# Patient Record
Sex: Male | Born: 1960 | Race: Black or African American | Hispanic: No | Marital: Single | Smoking: Current every day smoker
Health system: Southern US, Community
[De-identification: ages and names within clinical notes are randomized; demographics above are authoritative.]

## PROBLEM LIST (undated history)

## (undated) DIAGNOSIS — K219 Gastro-esophageal reflux disease without esophagitis: Secondary | ICD-10-CM

## (undated) DIAGNOSIS — F329 Major depressive disorder, single episode, unspecified: Secondary | ICD-10-CM

## (undated) DIAGNOSIS — Z970 Presence of artificial eye: Secondary | ICD-10-CM

## (undated) DIAGNOSIS — F419 Anxiety disorder, unspecified: Secondary | ICD-10-CM

## (undated) DIAGNOSIS — I10 Essential (primary) hypertension: Secondary | ICD-10-CM

## (undated) DIAGNOSIS — H409 Unspecified glaucoma: Secondary | ICD-10-CM

## (undated) DIAGNOSIS — F1910 Other psychoactive substance abuse, uncomplicated: Secondary | ICD-10-CM

## (undated) DIAGNOSIS — F32A Depression, unspecified: Secondary | ICD-10-CM

## (undated) DIAGNOSIS — F1020 Alcohol dependence, uncomplicated: Secondary | ICD-10-CM

## (undated) HISTORY — DX: Gastro-esophageal reflux disease without esophagitis: K21.9

## (undated) HISTORY — DX: Presence of artificial eye: Z97.0

## (undated) HISTORY — DX: Anxiety disorder, unspecified: F41.9

## (undated) HISTORY — DX: Essential (primary) hypertension: I10

## (undated) HISTORY — DX: Unspecified glaucoma: H40.9

## (undated) HISTORY — PX: KNEE SURGERY: SHX244

## (undated) HISTORY — DX: Depression, unspecified: F32.A

## (undated) HISTORY — PX: NOSE SURGERY: SHX723

---
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
## 2015-04-14 HISTORY — PX: OCULAR PROSTHESIS REMOVAL: SHX364

## 2017-02-18 ENCOUNTER — Other Ambulatory Visit: Payer: Self-pay | Admitting: Nurse Practitioner

## 2017-02-18 DIAGNOSIS — M545 Low back pain: Secondary | ICD-10-CM

## 2017-02-24 ENCOUNTER — Ambulatory Visit (HOSPITAL_COMMUNITY)
Admission: RE | Admit: 2017-02-24 | Discharge: 2017-02-24 | Disposition: A | Discharge status: Home / Self Care | Payer: 59 | Source: Ambulatory Visit | Attending: Nurse Practitioner | Admitting: Nurse Practitioner

## 2017-02-24 DIAGNOSIS — M48061 Spinal stenosis, lumbar region without neurogenic claudication: Secondary | ICD-10-CM | POA: Insufficient documentation

## 2017-02-24 DIAGNOSIS — R200 Anesthesia of skin: Secondary | ICD-10-CM | POA: Diagnosis not present

## 2017-02-24 DIAGNOSIS — M545 Low back pain: Secondary | ICD-10-CM | POA: Insufficient documentation

## 2017-02-24 DIAGNOSIS — M5136 Other intervertebral disc degeneration, lumbar region: Secondary | ICD-10-CM | POA: Insufficient documentation

## 2017-03-02 ENCOUNTER — Ambulatory Visit (HOSPITAL_COMMUNITY): Payer: Self-pay | Admitting: Licensed Clinical Social Worker

## 2017-03-02 ENCOUNTER — Encounter (INDEPENDENT_AMBULATORY_CARE_PROVIDER_SITE_OTHER): Payer: Self-pay

## 2017-03-02 ENCOUNTER — Encounter (HOSPITAL_COMMUNITY): Payer: Self-pay | Admitting: Licensed Clinical Social Worker

## 2017-03-02 DIAGNOSIS — F4323 Adjustment disorder with mixed anxiety and depressed mood: Secondary | ICD-10-CM
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
## 2017-03-17 ENCOUNTER — Other Ambulatory Visit: Payer: Self-pay | Admitting: Orthopedic Surgery

## 2017-03-17 DIAGNOSIS — M5416 Radiculopathy, lumbar region: Principal | ICD-10-CM

## 2017-03-17 DIAGNOSIS — M48061 Spinal stenosis, lumbar region without neurogenic claudication: Secondary | ICD-10-CM

## 2017-04-02 ENCOUNTER — Ambulatory Visit
Admission: RE | Admit: 2017-04-02 | Discharge: 2017-04-02 | Disposition: A | Discharge status: Home / Self Care | Payer: 59 | Source: Ambulatory Visit | Attending: Orthopedic Surgery | Admitting: Orthopedic Surgery

## 2017-04-02 DIAGNOSIS — M5416 Radiculopathy, lumbar region: Principal | ICD-10-CM

## 2017-04-02 DIAGNOSIS — M48061 Spinal stenosis, lumbar region without neurogenic claudication: Secondary | ICD-10-CM

## 2017-04-02 MED ORDER — ONDANSETRON HCL 4 MG/2ML IJ SOLN
4.0000 mg | Freq: Once | INTRAMUSCULAR | Status: AC
  Administered 2017-04-02: 4 mg via INTRAMUSCULAR

## 2017-04-02 MED ORDER — ONDANSETRON HCL 4 MG/2ML IJ SOLN: 4.0000 mg | Freq: Four times a day (QID) | INTRAMUSCULAR | Status: DC | PRN

## 2017-04-02 MED ORDER — DIAZEPAM 5 MG PO TABS
10.0000 mg | ORAL_TABLET | Freq: Once | ORAL | Status: AC
  Administered 2017-04-02: 10 mg via ORAL

## 2017-04-02 MED ORDER — IOPAMIDOL (ISOVUE-M 200) INJECTION 41%
14.0000 mL | Freq: Once | INTRAMUSCULAR | Status: AC
  Administered 2017-04-02: 14 mL via INTRATHECAL

## 2017-04-02 MED ORDER — MEPERIDINE HCL 50 MG/ML IJ SOLN
50.0000 mg | Freq: Once | INTRAMUSCULAR | Status: AC
  Administered 2017-04-02: 50 mg via INTRAMUSCULAR

## 2017-04-02 NOTE — Discharge Instructions (Signed)

## 2017-04-14 ENCOUNTER — Ambulatory Visit (HOSPITAL_COMMUNITY): Payer: 59 | Admitting: Licensed Clinical Social Worker

## 2017-04-21 ENCOUNTER — Ambulatory Visit (HOSPITAL_COMMUNITY): Payer: Self-pay | Admitting: Licensed Clinical Social Worker

## 2017-08-18 NOTE — Congregational Nurse Program (Signed)
Congregational Nurse Program Note  Date of Encounter: 08/18/2017  Past Medical History: No past medical history on file.  Encounter Details: CNP Questionnaire - 08/18/17 1119      Questionnaire   Patient Status  Not Applicable    Race  Black or African American    Location Patient Served At  [employer], [employer]  Not Applicable    Uninsured  Uninsured (NEW 1x/quarter)    Food  No food insecurities    Housing/Utilities  Yes, have permanent housing    Transportation  No transportation needs    Interpersonal Safety  Yes, feel physically and emotionally safe where you currently live    Medication  No medication insecurities    Medical Provider  No    Referrals  Not Applicable    ED Visit Averted  Not Applicable    Life-Saving Intervention Made  Not Applicable     Seen at the [facility].Does not have a medical provider I nformation given about.[facility] B/P/123/79, P 80 Jenene Slicker RN., [facility], [phone]

## 2017-10-19 NOTE — Congregational Nurse Program (Signed)
Congregational Nurse Program Note  Date of Encounter: 10/19/2017  Past Medical History: No past medical history on file.  Encounter Details: CNP Questionnaire - 10/19/17 2130      Questionnaire   Patient Status  Not Applicable    Race  Black or African American    [facility] [place]    Insurance  Private Insurance    Uninsured  Not Applicable    Food  No food insecurities    Housing/Utilities  Yes, have permanent housing    Transportation  No transportation needs    Interpersonal Safety  Yes, feel physically and emotionally safe where you currently live    Medication  No medication insecurities    Medical Provider  No    Referrals  Not Applicable    ED Visit Averted  Not Applicable    Life-Saving Intervention Made  Not Applicable      [facility] 09[facility]. No complaints  B/P 132/87 -P [address] Torah Pinnock RN, [facility], [phone]

## 2017-12-15 ENCOUNTER — Emergency Department (HOSPITAL_COMMUNITY)
Admission: EM | Admit: 2017-12-15 | Discharge: 2017-12-15 | Disposition: A | Discharge status: Home / Self Care | Payer: No Typology Code available for payment source | Attending: Emergency Medicine | Admitting: Emergency Medicine

## 2017-12-15 ENCOUNTER — Emergency Department (HOSPITAL_COMMUNITY): Payer: No Typology Code available for payment source

## 2017-12-15 ENCOUNTER — Encounter (HOSPITAL_COMMUNITY): Payer: Self-pay | Admitting: Emergency Medicine

## 2017-12-15 ENCOUNTER — Other Ambulatory Visit: Payer: Self-pay

## 2017-12-15 DIAGNOSIS — T148XXA Other injury of unspecified body region, initial encounter: Secondary | ICD-10-CM | POA: Insufficient documentation

## 2017-12-15 DIAGNOSIS — S93402A Sprain of unspecified ligament of left ankle, initial encounter: Secondary | ICD-10-CM | POA: Insufficient documentation

## 2017-12-15 DIAGNOSIS — Y929 Unspecified place or not applicable: Secondary | ICD-10-CM | POA: Insufficient documentation

## 2017-12-15 DIAGNOSIS — Y9389 Activity, other specified: Secondary | ICD-10-CM | POA: Diagnosis not present

## 2017-12-15 DIAGNOSIS — S99912A Unspecified injury of left ankle, initial encounter: Secondary | ICD-10-CM | POA: Diagnosis present

## 2017-12-15 DIAGNOSIS — Y999 Unspecified external cause status: Secondary | ICD-10-CM | POA: Insufficient documentation

## 2017-12-15 DIAGNOSIS — T07XXXA Unspecified multiple injuries, initial encounter: Secondary | ICD-10-CM

## 2017-12-15 LAB — BASIC METABOLIC PANEL
ANION GAP: 9 (ref 5–15)
BUN: 5 mg/dL — ABNORMAL LOW (ref 6–20)
CHLORIDE: 107 mmol/L (ref 98–111)
CO2: 26 mmol/L (ref 22–32)
Calcium: 8.9 mg/dL (ref 8.9–10.3)
Creatinine, Ser: 1.06 mg/dL (ref 0.61–1.24)
GFR calc non Af Amer: >60 mL/min (ref >60)
Glucose, Bld: 98 mg/dL (ref 70–99)
Potassium: 3.8 mmol/L (ref 3.5–5.1)
Sodium: 142 mmol/L (ref 135–145)

## 2017-12-15 MED ORDER — CYCLOBENZAPRINE HCL 10 MG PO TABS: 10.0000 mg | ORAL_TABLET | Freq: Three times a day (TID) | ORAL | 0 refills | Status: DC

## 2017-12-15 MED ORDER — HYDROCODONE-ACETAMINOPHEN 5-325 MG PO TABS
2.0000 | ORAL_TABLET | Freq: Once | ORAL | Status: AC
  Administered 2017-12-15: 2 via ORAL
  Filled 2017-12-15: qty 2

## 2017-12-15 MED ORDER — METHOCARBAMOL 500 MG PO TABS
500.0000 mg | ORAL_TABLET | Freq: Once | ORAL | Status: AC
  Administered 2017-12-15: 500 mg via ORAL
  Filled 2017-12-15: qty 1

## 2017-12-15 MED ORDER — ONDANSETRON HCL 4 MG PO TABS
4.0000 mg | ORAL_TABLET | Freq: Once | ORAL | Status: AC
  Administered 2017-12-15: 4 mg via ORAL
  Filled 2017-12-15: qty 1

## 2017-12-15 MED ORDER — IBUPROFEN 600 MG PO TABS: 600.0000 mg | ORAL_TABLET | Freq: Four times a day (QID) | ORAL | 0 refills | Status: DC

## 2017-12-15 MED ORDER — IOPAMIDOL (ISOVUE-300) INJECTION 61%
100.0000 mL | Freq: Once | INTRAVENOUS | Status: AC | PRN
  Administered 2017-12-15: 100 mL via INTRAVENOUS
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
## 2017-12-15 NOTE — ED Notes (Addendum)
Pt ambulated with steady gait and reported intermittent dizziness prior to discharge. PA aware and reported okay to discharge pt.

## 2017-12-15 NOTE — ED Notes (Signed)
Pt in CT/Radiology at this time.

## 2017-12-15 NOTE — Discharge Instructions (Addendum)
The CT scan of your head is negative for skull fracture or brain injury.  The CT scan of your neck is negative for fracture or dislocation.  The CT scan of your abdomen and pelvis shows no rib fracture on the right or the left, no intra-abdominal abnormality, and no pelvis abnormality.  The x-ray of your ankle is negative for fracture.  Your examination favors a contusion to the side of your head.  Some muscle tightness involving your upper shoulder and neck muscles, and a left ankle sprain.  Please use ibuprofen with breakfast, lunch, dinner, and at bedtime.  Please use Flexeril 3 times daily for spasm pain. This medication may cause drowsiness. Please do not drink, drive, or participate in activity that requires concentration while taking this medication.  Please use your ankle stirrup splint over the next 7 to 10 days to improve your ankle sprain.  Please see your physicians at the [facility], or return to the emergency department if any changes in your condition, problems, or concerns.
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
## 2017-12-20 ENCOUNTER — Emergency Department (HOSPITAL_COMMUNITY)
Admission: EM | Admit: 2017-12-20 | Discharge: 2017-12-20 | Disposition: A | Discharge status: Home Health | Payer: No Typology Code available for payment source | Attending: Emergency Medicine | Admitting: Emergency Medicine

## 2017-12-20 ENCOUNTER — Other Ambulatory Visit: Payer: Self-pay

## 2017-12-20 ENCOUNTER — Emergency Department (HOSPITAL_COMMUNITY): Payer: No Typology Code available for payment source

## 2017-12-20 ENCOUNTER — Encounter (HOSPITAL_COMMUNITY): Payer: Self-pay | Admitting: Emergency Medicine

## 2017-12-20 DIAGNOSIS — F17210 Nicotine dependence, cigarettes, uncomplicated: Secondary | ICD-10-CM | POA: Insufficient documentation

## 2017-12-20 DIAGNOSIS — M25511 Pain in right shoulder: Secondary | ICD-10-CM | POA: Insufficient documentation

## 2017-12-20 DIAGNOSIS — Z79899 Other long term (current) drug therapy: Secondary | ICD-10-CM | POA: Diagnosis not present
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
## 2017-12-29 ENCOUNTER — Ambulatory Visit: Payer: Self-pay | Admitting: Orthopaedic Surgery

## 2018-11-04 ENCOUNTER — Other Ambulatory Visit: Payer: Self-pay

## 2018-11-04 DIAGNOSIS — Z20822 Contact with and (suspected) exposure to covid-19: Secondary | ICD-10-CM

## 2018-11-07 LAB — NOVEL CORONAVIRUS, NAA: SARS-CoV-2, NAA: NOT DETECTED

## 2018-11-09 ENCOUNTER — Telehealth: Payer: Self-pay

## 2018-11-09 NOTE — Telephone Encounter (Signed)
Pt received negative covid test results  °

## 2018-12-26 DIAGNOSIS — Z139 Encounter for screening, unspecified: Secondary | ICD-10-CM

## 2018-12-26 LAB — POCT GLYCOSYLATED HEMOGLOBIN (HGB A1C): Hemoglobin A1C: 5.7 % — AB (ref 4.0–5.6)

## 2018-12-26 LAB — GLUCOSE, POCT (MANUAL RESULT ENTRY): POC Glucose: 108 mg/dl — AB (ref 70–99)
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
## 2018-12-27 ENCOUNTER — Ambulatory Visit: Payer: Self-pay | Admitting: Physician Assistant

## 2019-01-02 ENCOUNTER — Ambulatory Visit: Payer: Self-pay | Admitting: Physician Assistant

## 2019-01-16 ENCOUNTER — Ambulatory Visit: Payer: Self-pay | Admitting: Physician Assistant

## 2019-02-13 ENCOUNTER — Encounter: Payer: Self-pay | Admitting: Physician Assistant

## 2019-02-13 ENCOUNTER — Other Ambulatory Visit: Payer: Self-pay

## 2019-02-13 ENCOUNTER — Ambulatory Visit: Payer: Self-pay | Admitting: Physician Assistant

## 2019-02-13 VITALS — BP 102/80 | HR 72 | Temp 97.3°F | Wt 180.4 lb

## 2019-02-13 DIAGNOSIS — H579 Unspecified disorder of eye and adnexa: Secondary | ICD-10-CM

## 2019-02-13 DIAGNOSIS — Z970 Presence of artificial eye: Secondary | ICD-10-CM

## 2019-02-13 DIAGNOSIS — Z7689 Persons encountering health services in other specified circumstances: Secondary | ICD-10-CM

## 2019-02-13 DIAGNOSIS — Z13220 Encounter for screening for lipoid disorders: Secondary | ICD-10-CM

## 2019-02-13 DIAGNOSIS — F489 Nonpsychotic mental disorder, unspecified: Secondary | ICD-10-CM

## 2019-02-13 DIAGNOSIS — Z1211 Encounter for screening for malignant neoplasm of colon: Secondary | ICD-10-CM

## 2019-02-13 DIAGNOSIS — Z125 Encounter for screening for malignant neoplasm of prostate: Secondary | ICD-10-CM

## 2019-02-13 DIAGNOSIS — Z2821 Immunization not carried out because of patient refusal: Secondary | ICD-10-CM
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
## 2019-02-14 ENCOUNTER — Other Ambulatory Visit: Payer: Self-pay | Admitting: Physician Assistant

## 2019-02-14 DIAGNOSIS — Z1211 Encounter for screening for malignant neoplasm of colon: Secondary | ICD-10-CM

## 2019-03-03 ENCOUNTER — Other Ambulatory Visit: Payer: Self-pay

## 2019-03-03 DIAGNOSIS — Z20822 Contact with and (suspected) exposure to covid-19: Secondary | ICD-10-CM

## 2019-03-05 LAB — NOVEL CORONAVIRUS, NAA: SARS-CoV-2, NAA: NOT DETECTED

## 2019-03-20 ENCOUNTER — Ambulatory Visit: Payer: Self-pay | Admitting: Physician Assistant

## 2019-04-18 ENCOUNTER — Ambulatory Visit: Payer: Self-pay | Attending: Internal Medicine

## 2019-04-18 ENCOUNTER — Other Ambulatory Visit: Payer: Self-pay

## 2019-04-18 DIAGNOSIS — Z20822 Contact with and (suspected) exposure to covid-19: Secondary | ICD-10-CM

## 2019-04-20 LAB — NOVEL CORONAVIRUS, NAA: SARS-CoV-2, NAA: NOT DETECTED

## 2019-04-24 ENCOUNTER — Telehealth: Payer: Self-pay

## 2019-04-24 NOTE — Telephone Encounter (Signed)
Pt notified of negative COVID-19 results. Understanding verbalized.  Chasta M Hopkins   

## 2019-05-23 NOTE — Congregational Nurse Program (Signed)
Dept: [phone]   Congregational Nurse Program Note  Date of Encounter: 05/22/2019  Past Medical History: Past Medical History:  Diagnosis Date  . Anxiety   . Depression   . GERD (gastroesophageal reflux disease)   . Glaucoma   . Hypertension   . Prosthetic eye globe    L eye    Encounter Details: CNP Questionnaire - 05/22/19 1004      Questionnaire   Patient Status  Not Applicable    Race  Black or African American    Location Patient Served At  [facility] [employer]  Not Applicable    Uninsured  Uninsured (NEW 1x/quarter)    Food  Yes, have food insecurities    Housing/Utilities  Yes, have permanent housing;Worried about losing housing    Transportation  Yes, need transportation assistance;Within past 12 months, lack of transportation negatively impacted life    Interpersonal Safety  Yes, feel physically and emotionally safe where you currently live    Medication  Yes, have medication insecurities    Medical Provider  Yes    Referrals  Other;Area Agency;Behavioral/Mental Health Provider;Primary Care Provider/Clinic    ED Visit Averted  Not Applicable    Life-Saving Intervention Made  Not Applicable       [facility]. Client is enrolled in Care Connect and was referred to [facility]. He has only had one visit there and  No follow ups. Client reports reason is that he had no way to get there. 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 He states now he works at [employer] and goes in [employer] at [employer].  Client walked to [facility] today and he lives close.  Client reports ongoing anxiety and depression. Difficult today to talk privately as this is open public space. Client was recommended to go to [facility] in the past. From previous note he did have a suicide attempt in Prison.  Client reports that he does consume alcohol and that is causing some issues  with his daily life, was not forthcoming with how much alcohol.   With patient present we discussed helping him [facility] to [facility] his [facility] work and his blood pressure. Client states he has been told his blood sugars have been high before and is concerned about Diabetes. Last POCT A[facility]C at [facility] was 5.7.  Called [facility] and spoke to [place], client is rescheduled a phone visit Thursday morning. 05/25/19 at 0830am Discussed with client to please answer his phone.  Also per [facility] client has not had labs drawn that were ordered last Nov. Client instructed to go Tuesday or Wednesday morning to [facility] to have blood work drawn, client instructed what time they open and what entrance to use.   Discussed at length regarding Mental Health needs for his anxiety and depression and that [facility] would work him to provide transportation. We will work with client after his appointment on Thursday to arrange transportation. It will be important to have him do early morning walk in intake so that he is back in time for work at [employer].  SW interns should be starting again Thursday of this week, will refer him to an MSW intern to assess and work on getting him to [facility].. Client is agreeable  We also discussed that [facility] can also help him with alcohol if he is wanting that assistance now, however  he does need help with anxiety and depression. Client is agreeable.  Discussed with client that to arrange any transportation through [facility] for medical or mental health appointments we need at least 24 hours on a business day and that 2 to 3 business days is preferred when possible to confirm assistance. CM phone number given to client. Will plan to call client again Wednesday 05/24/19 to remind him of his free clinic appointment and to assess how he is doing mentally. Client agreeable.   Blood pressure today 148/88, sitting normal cuff and pulse is 88. [facility], however client was given a washable reusable cloth mask and instructed to use it when he is out in public. Client will need much encouragement and follow up.   Debria Garret RN

## 2019-05-25 ENCOUNTER — Encounter: Payer: Self-pay | Admitting: Physician Assistant

## 2019-05-25 ENCOUNTER — Ambulatory Visit: Payer: Self-pay | Admitting: Physician Assistant

## 2019-05-25 DIAGNOSIS — Z9119 Patient's noncompliance with other medical treatment and regimen: Secondary | ICD-10-CM

## 2019-05-25 DIAGNOSIS — F489 Nonpsychotic mental disorder, unspecified: Secondary | ICD-10-CM

## 2019-05-25 DIAGNOSIS — H579 Unspecified disorder of eye and adnexa: Secondary | ICD-10-CM

## 2019-05-25 DIAGNOSIS — Z91199 Patient's noncompliance with other medical treatment and regimen due to unspecified reason: Secondary | ICD-10-CM

## 2019-05-25 DIAGNOSIS — F17200 Nicotine dependence, unspecified, uncomplicated: Secondary | ICD-10-CM

## 2019-05-25 NOTE — Progress Notes (Signed)
   There were no vitals taken for this visit.   Subjective:    Patient ID: Garrett Black, male    DOB: Apr 06, 1961, 59 y.o.   MRN: [id number]  HPI: Garrett Black is a 59 y.o. male presenting on 05/25/2019 for No chief complaint on file.   HPI  This is a telemedicine appointment due to coronavirus pandemic.  It is via Telephone as pt says he does not have a video enabled device.   I connected with  Glenford L. Deltoro on 05/25/19 by a video enabled telemedicine application and verified that I am speaking with the correct person using two identifiers.   I discussed the limitations of evaluation and management by telemedicine. The patient expressed understanding and agreed to proceed.  Pt is at home.  Provider is at office.   Pt is 58yoM who was seen 02/13/19 to establish care.  Today is his follow up in 1 month appointment.    Pt was recommended to [facility] for [facility] health issues.  Pt didn't contact [facility]- says he's "sidetracked" with doing other things.  Pt was instructed to go to lab to get baseline testing done.  He Didn't get labs done.  He did not return his iFOBT that was given to him for colon cancer screening.     Pt says he has No new issues today    Relevant past medical, surgical, family and social history reviewed and updated as indicated. Interim medical history since our last visit reviewed. Allergies and medications reviewed and updated.  Review of Systems  Per HPI unless specifically indicated above     Objective:    There were no vitals taken for this visit.  Wt Readings from Last 3 Encounters:  02/13/19 180 lb 6.4 oz (81.8 kg)  12/20/17 214 lb (97.1 kg)  12/15/17 214 lb 15.2 oz (97.5 kg)    Physical Exam Pulmonary:     Effort: Pulmonary effort is normal. No respiratory distress.  Neurological:     Mental Status: He is alert and oriented to person, place, and time.  Psychiatric:        Attention and Perception: Attention normal.        Speech:  Speech normal.        Behavior: Behavior is cooperative.          Assessment & Plan:   Encounter Diagnoses  Name Primary?  . Personal history of noncompliance with medical treatment, presenting hazards to health Yes  . Mental health problem   . Disorder of right eye   . Tobacco use disorder       -pt encouraged to [facility] for help with mental health issues -Pt to Get labs.  He will be called with results -Pt was given contact information for services for the blind that he can call to check on vision assistance issues -pt is encouraged to Wear a mask when in public to reduce risk of covid -pt to follow up in 6 months.  He is to contact office sooner prn

## 2019-09-16 ENCOUNTER — Emergency Department (HOSPITAL_COMMUNITY): Payer: Self-pay

## 2019-09-16 ENCOUNTER — Other Ambulatory Visit: Payer: Self-pay

## 2019-09-16 ENCOUNTER — Encounter (HOSPITAL_COMMUNITY): Payer: Self-pay

## 2019-09-16 ENCOUNTER — Emergency Department (HOSPITAL_COMMUNITY)
Admission: EM | Admit: 2019-09-16 | Discharge: 2019-09-16 | Disposition: A | Discharge status: Home / Self Care | Payer: Self-pay | Attending: Emergency Medicine | Admitting: Emergency Medicine

## 2019-09-16 DIAGNOSIS — Z79899 Other long term (current) drug therapy: Secondary | ICD-10-CM | POA: Insufficient documentation

## 2019-09-16 DIAGNOSIS — Y9389 Activity, other specified: Secondary | ICD-10-CM | POA: Insufficient documentation

## 2019-09-16 DIAGNOSIS — S0083XA Contusion of other part of head, initial encounter: Secondary | ICD-10-CM

## 2019-09-16 DIAGNOSIS — F17210 Nicotine dependence, cigarettes, uncomplicated: Secondary | ICD-10-CM | POA: Insufficient documentation

## 2019-09-16 DIAGNOSIS — S0181XA Laceration without foreign body of other part of head, initial encounter: Secondary | ICD-10-CM | POA: Insufficient documentation

## 2019-09-16 DIAGNOSIS — Y999 Unspecified external cause status: Secondary | ICD-10-CM | POA: Insufficient documentation

## 2019-09-16 DIAGNOSIS — S022XXA Fracture of nasal bones, initial encounter for closed fracture: Secondary | ICD-10-CM | POA: Insufficient documentation

## 2019-09-16 DIAGNOSIS — I10 Essential (primary) hypertension: Secondary | ICD-10-CM | POA: Insufficient documentation

## 2019-09-16 DIAGNOSIS — Y929 Unspecified place or not applicable: Secondary | ICD-10-CM | POA: Insufficient documentation

## 2019-09-16 MED ORDER — ONDANSETRON 8 MG PO TBDP
8.0000 mg | ORAL_TABLET | Freq: Once | ORAL | Status: AC
  Administered 2019-09-16: 8 mg via ORAL
  Filled 2019-09-16: qty 1

## 2019-09-16 MED ORDER — ERYTHROMYCIN 5 MG/GM OP OINT
TOPICAL_OINTMENT | Freq: Once | OPHTHALMIC | Status: AC
  Administered 2019-09-16: 1 via OPHTHALMIC
  Filled 2019-09-16: qty 3.5

## 2019-09-16 MED ORDER — LIDOCAINE-EPINEPHRINE (PF) 2 %-1:200000 IJ SOLN
INTRAMUSCULAR | Status: AC
  Filled 2019-09-16: qty 20

## 2019-09-16 MED ORDER — HYDROCODONE-ACETAMINOPHEN 5-325 MG PO TABS: 1.0000 | ORAL_TABLET | ORAL | 0 refills | Status: DC | PRN

## 2019-09-16 MED ORDER — TETANUS-DIPHTH-ACELL PERTUSSIS 5-2.5-18.5 LF-MCG/0.5 IM SUSP
0.5000 mL | Freq: Once | INTRAMUSCULAR | Status: AC
  Administered 2019-09-16: 0.5 mL via INTRAMUSCULAR
  Filled 2019-09-16: qty 0.5

## 2019-09-16 MED ORDER — HYDROMORPHONE HCL 1 MG/ML IJ SOLN
1.0000 mg | Freq: Once | INTRAMUSCULAR | Status: AC
  Administered 2019-09-16: 1 mg via INTRAMUSCULAR
  Filled 2019-09-16: qty 1

## 2019-09-16 MED ORDER — SALINE SPRAY 0.65 % NA SOLN
1.0000 | NASAL | Status: DC | PRN
  Filled 2019-09-16: qty 44

## 2019-09-16 MED ORDER — OXYCODONE-ACETAMINOPHEN 5-325 MG PO TABS
2.0000 | ORAL_TABLET | Freq: Once | ORAL | Status: AC
  Administered 2019-09-16: 2 via ORAL
  Filled 2019-09-16: qty 2

## 2019-09-16 MED ORDER — ERYTHROMYCIN 5 MG/GM OP OINT
TOPICAL_OINTMENT | OPHTHALMIC | 0 refills | Status: DC
Start: 2019-09-16 — End: 2020-01-01

## 2019-09-16 MED ORDER — LIDOCAINE-EPINEPHRINE (PF) 2 %-1:200000 IJ SOLN
20.0000 mL | Freq: Once | INTRAMUSCULAR | Status: AC
  Administered 2019-09-16: 20 mL

## 2019-09-16 MED ORDER — DOXYCYCLINE HYCLATE 100 MG PO CAPS
100.0000 mg | ORAL_CAPSULE | Freq: Two times a day (BID) | ORAL | 0 refills | Status: DC
Start: 2019-09-16 — End: 2020-01-01
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
## 2019-09-18 MED FILL — Hydrocodone-Acetaminophen Tab 5-325 MG: ORAL | Qty: 6 | Status: AC

## 2019-11-29 ENCOUNTER — Other Ambulatory Visit: Payer: Self-pay | Admitting: Physician Assistant

## 2019-11-29 DIAGNOSIS — Z125 Encounter for screening for malignant neoplasm of prostate: Secondary | ICD-10-CM

## 2019-11-29 DIAGNOSIS — Z13220 Encounter for screening for lipoid disorders: Secondary | ICD-10-CM

## 2019-12-04 ENCOUNTER — Ambulatory Visit: Payer: Self-pay | Admitting: Physician Assistant

## 2020-01-01 ENCOUNTER — Encounter (HOSPITAL_COMMUNITY): Payer: Self-pay | Admitting: Emergency Medicine

## 2020-01-01 ENCOUNTER — Other Ambulatory Visit: Payer: Self-pay

## 2020-01-01 ENCOUNTER — Emergency Department (HOSPITAL_COMMUNITY)
Admission: EM | Admit: 2020-01-01 | Discharge: 2020-01-03 | Disposition: A | Discharge status: Home / Self Care | Payer: Self-pay | Attending: Emergency Medicine | Admitting: Emergency Medicine

## 2020-01-01 ENCOUNTER — Emergency Department (HOSPITAL_COMMUNITY): Payer: Self-pay

## 2020-01-01 DIAGNOSIS — I10 Essential (primary) hypertension: Secondary | ICD-10-CM | POA: Insufficient documentation

## 2020-01-01 DIAGNOSIS — R45851 Suicidal ideations: Secondary | ICD-10-CM | POA: Insufficient documentation

## 2020-01-01 DIAGNOSIS — R441 Visual hallucinations: Secondary | ICD-10-CM | POA: Insufficient documentation

## 2020-01-01 DIAGNOSIS — R45850 Homicidal ideations: Secondary | ICD-10-CM | POA: Insufficient documentation

## 2020-01-01 DIAGNOSIS — M25512 Pain in left shoulder: Secondary | ICD-10-CM | POA: Insufficient documentation

## 2020-01-01 DIAGNOSIS — R440 Auditory hallucinations: Secondary | ICD-10-CM | POA: Insufficient documentation

## 2020-01-01 DIAGNOSIS — F17210 Nicotine dependence, cigarettes, uncomplicated: Secondary | ICD-10-CM | POA: Insufficient documentation

## 2020-01-01 LAB — COMPREHENSIVE METABOLIC PANEL
ALT: 32 U/L (ref 0–44)
AST: 43 U/L — ABNORMAL HIGH (ref 15–41)
Albumin: 4.4 g/dL (ref 3.5–5.0)
Alkaline Phosphatase: 49 U/L (ref 38–126)
Anion gap: 11 (ref 5–15)
BUN: 9 mg/dL (ref 6–20)
CO2: 28 mmol/L (ref 22–32)
Calcium: 9.4 mg/dL (ref 8.9–10.3)
Chloride: 103 mmol/L (ref 98–111)
Creatinine, Ser: 1.14 mg/dL (ref 0.61–1.24)
GFR calc Af Amer: >60 mL/min (ref >60)
GFR calc non Af Amer: >60 mL/min (ref >60)
Glucose, Bld: 84 mg/dL (ref 70–99)
Potassium: 4.8 mmol/L (ref 3.5–5.1)
Sodium: 142 mmol/L (ref 135–145)
Total Bilirubin: 1.2 mg/dL (ref 0.3–1.2)
Total Protein: 7.5 g/dL (ref 6.5–8.1)

## 2020-01-01 LAB — CBC
HCT: 47.9 % (ref 39.0–52.0)
Hemoglobin: 14.8 g/dL (ref 13.0–17.0)
MCH: 24.5 pg — ABNORMAL LOW (ref 26.0–34.0)
MCHC: 30.9 g/dL (ref 30.0–36.0)
MCV: 79.4 fL — ABNORMAL LOW (ref 80.0–100.0)
Platelets: 297 10*3/uL (ref 150–400)
RBC: 6.03 MIL/uL — ABNORMAL HIGH (ref 4.22–5.81)
RDW: 16.3 % — ABNORMAL HIGH (ref 11.5–15.5)
WBC: 9.3 10*3/uL (ref 4.0–10.5)
nRBC: 0 % (ref 0.0–0.2)

## 2020-01-01 LAB — RAPID URINE DRUG SCREEN, HOSP PERFORMED
Amphetamines: NOT DETECTED
Barbiturates: NOT DETECTED
Benzodiazepines: NOT DETECTED
Cocaine: POSITIVE — AB
Opiates: NOT DETECTED
Tetrahydrocannabinol: NOT DETECTED

## 2020-01-01 LAB — ACETAMINOPHEN LEVEL: Acetaminophen (Tylenol), Serum: <10 ug/mL — ABNORMAL LOW (ref 10–30)

## 2020-01-01 LAB — ETHANOL: Alcohol, Ethyl (B): <10 mg/dL (ref <10)

## 2020-01-01 LAB — SALICYLATE LEVEL: Salicylate Lvl: <7 mg/dL — ABNORMAL LOW (ref 7.0–30.0)

## 2020-01-01 MED ORDER — LIDOCAINE 5 % EX PTCH
1.0000 | MEDICATED_PATCH | Freq: Once | CUTANEOUS | Status: AC
  Administered 2020-01-01: 1 via TRANSDERMAL
  Filled 2020-01-01: qty 1

## 2020-01-01 NOTE — ED Triage Notes (Signed)
Patient is having suicidal thoughts without a plan, starting this am, also heavy alcohol and drug abuse.  Last [place] of crack cocaine and alcohol this morning.

## 2020-01-01 NOTE — ED Notes (Signed)
TTS assessment completed [name], NP, patient meets inpatient criteria. [name], [facility], no beds available. Disposition SW to secure placement.
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
## 2020-01-02 MED ORDER — LORAZEPAM 2 MG/ML IJ SOLN: 0.0000 mg | Freq: Two times a day (BID) | INTRAMUSCULAR | Status: DC

## 2020-01-02 MED ORDER — LORAZEPAM 1 MG PO TABS
0.0000 mg | ORAL_TABLET | Freq: Four times a day (QID) | ORAL | Status: DC
  Administered 2020-01-02: 1 mg via ORAL
  Filled 2020-01-02: qty 1

## 2020-01-02 MED ORDER — THIAMINE HCL 100 MG PO TABS
100.0000 mg | ORAL_TABLET | Freq: Every day | ORAL | Status: DC
  Administered 2020-01-02 – 2020-01-03 (×2): 100 mg via ORAL
  Filled 2020-01-02 (×2): qty 1

## 2020-01-02 MED ORDER — LORAZEPAM 1 MG PO TABS: 0.0000 mg | ORAL_TABLET | Freq: Two times a day (BID) | ORAL | Status: DC

## 2020-01-02 MED ORDER — LORAZEPAM 2 MG/ML IJ SOLN: 0.0000 mg | Freq: Four times a day (QID) | INTRAMUSCULAR | Status: DC

## 2020-01-02 MED ORDER — THIAMINE HCL 100 MG/ML IJ SOLN: 100.0000 mg | Freq: Every day | INTRAMUSCULAR | Status: DC

## 2020-01-02 NOTE — ED Notes (Signed)
Pt requested a note be put in pt's chart to allow staff to discuss his care with his daughter, Shadoe Bethel [phone] and Girlfriend, Annabelle Harman [phone]

## 2020-01-02 NOTE — ED Notes (Signed)
Pt states he takes no medication on a regular basis.

## 2020-01-02 NOTE — ED Notes (Signed)
Pt refused to let me check his vital signs.
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
## 2020-01-03 MED ORDER — SIMETHICONE 40 MG/0.6ML PO SUSP
ORAL | Status: AC
  Filled 2020-01-03: qty 0.6

## 2020-01-03 NOTE — Discharge Instructions (Signed)
Please follow up with one of the following outpatient substance use/mental health agencies:  [facility] [address] [place], [place] [address] [phone]  [facility]  (Assessment, Medication management and counseling) [address] [place], [place] [address] Phone: [phone] *Some walk-in assessment times available- call number above   [facility]  (Medication management and counseling) Address: [address], [place], [place] [address] Phone: [phone] *Accepts walk-ins M-F starting at 8am  [facility]  (Wellness classes, peer support) [address],  [place], [place] [address] Phone: [phone]  [facility]  (individual and group counseling)  [address] [place], [place] [address] [phone]  Suicide Prevention information listed below:  [facility] Behavioral Health Crisis Line: [phone]  Mobile Crisis Teams [facility]      Mobile Crisis Care Unit     [phone]

## 2020-01-03 NOTE — ED Notes (Signed)
[facility] called, pt is psych cleared. MD notified.

## 2020-01-03 NOTE — Progress Notes (Signed)
Pt has been psychiatrically cleared. CSW spoke with pt's daughter, Garwood Wentzell ([phone]), and provided suicide prevention education. She and her husband live in [place], [place] but communicate with pt frequently. She denies that there are any guns in his home and states that she will strongly encourage pt to engage in outpatient SU/MH services.   Outpatient SU/MH agencies have been placed in pt's AVS.   Wells Guiles, MSW, LCSW, LCAS Clinical Social Worker II Disposition CSW [phone]
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
## 2020-01-03 NOTE — Progress Notes (Signed)
Patient ID: Garrett Black, male   DOB: 19-Jul-1960, 59 y.o.   MRN: [id number]   CSW spoke to patient daughter for collateral information (see CSW note). Per CSW, daughter had no safety concerns with patient being discharged as such, patient is psychiatrically cleared. CSW will provide resources for psychiatric services to include substance abuse and therapy. ED nurse updated on disposition.

## 2020-01-04 ENCOUNTER — Encounter (HOSPITAL_COMMUNITY): Payer: Self-pay

## 2020-01-04 ENCOUNTER — Other Ambulatory Visit: Payer: Self-pay

## 2020-01-04 ENCOUNTER — Emergency Department (HOSPITAL_COMMUNITY)
Admission: EM | Admit: 2020-01-04 | Discharge: 2020-01-04 | Disposition: A | Discharge status: Home / Self Care | Payer: Self-pay | Attending: Emergency Medicine | Admitting: Emergency Medicine

## 2020-01-04 DIAGNOSIS — R45851 Suicidal ideations: Secondary | ICD-10-CM | POA: Insufficient documentation

## 2020-01-04 DIAGNOSIS — F17210 Nicotine dependence, cigarettes, uncomplicated: Secondary | ICD-10-CM | POA: Insufficient documentation

## 2020-01-04 DIAGNOSIS — F1420 Cocaine dependence, uncomplicated: Secondary | ICD-10-CM | POA: Insufficient documentation

## 2020-01-04 DIAGNOSIS — Z20822 Contact with and (suspected) exposure to covid-19: Secondary | ICD-10-CM | POA: Insufficient documentation

## 2020-01-04 DIAGNOSIS — F1020 Alcohol dependence, uncomplicated: Secondary | ICD-10-CM | POA: Insufficient documentation

## 2020-01-04 DIAGNOSIS — F1910 Other psychoactive substance abuse, uncomplicated: Secondary | ICD-10-CM | POA: Insufficient documentation

## 2020-01-04 DIAGNOSIS — I10 Essential (primary) hypertension: Secondary | ICD-10-CM | POA: Insufficient documentation

## 2020-01-04 HISTORY — DX: Other psychoactive substance abuse, uncomplicated: F19.10

## 2020-01-04 HISTORY — DX: Alcohol dependence, uncomplicated: F10.20

## 2020-01-04 LAB — SARS CORONAVIRUS 2 BY RT PCR (HOSPITAL ORDER, PERFORMED IN ~~LOC~~ HOSPITAL LAB): SARS Coronavirus 2: NEGATIVE

## 2020-01-04 LAB — CBC
HCT: 50.1 % (ref 39.0–52.0)
Hemoglobin: 15 g/dL (ref 13.0–17.0)
MCH: 24 pg — ABNORMAL LOW (ref 26.0–34.0)
MCHC: 29.9 g/dL — ABNORMAL LOW (ref 30.0–36.0)
MCV: 80.3 fL (ref 80.0–100.0)
Platelets: 241 10*3/uL (ref 150–400)
RBC: 6.24 MIL/uL — ABNORMAL HIGH (ref 4.22–5.81)
RDW: 15.9 % — ABNORMAL HIGH (ref 11.5–15.5)
WBC: 6.7 10*3/uL (ref 4.0–10.5)
nRBC: 0 % (ref 0.0–0.2)

## 2020-01-04 LAB — COMPREHENSIVE METABOLIC PANEL
ALT: 29 U/L (ref 0–44)
AST: 35 U/L (ref 15–41)
Albumin: 4.2 g/dL (ref 3.5–5.0)
Alkaline Phosphatase: 45 U/L (ref 38–126)
Anion gap: 9 (ref 5–15)
BUN: 9 mg/dL (ref 6–20)
CO2: 27 mmol/L (ref 22–32)
Calcium: 9.6 mg/dL (ref 8.9–10.3)
Chloride: 104 mmol/L (ref 98–111)
Creatinine, Ser: 1.08 mg/dL (ref 0.61–1.24)
GFR calc Af Amer: >60 mL/min (ref >60)
GFR calc non Af Amer: >60 mL/min (ref >60)
Glucose, Bld: 155 mg/dL — ABNORMAL HIGH (ref 70–99)
Potassium: 4.8 mmol/L (ref 3.5–5.1)
Sodium: 140 mmol/L (ref 135–145)
Total Bilirubin: 0.9 mg/dL (ref 0.3–1.2)
Total Protein: 7.4 g/dL (ref 6.5–8.1)

## 2020-01-04 LAB — SALICYLATE LEVEL: Salicylate Lvl: <7 mg/dL — ABNORMAL LOW (ref 7.0–30.0)

## 2020-01-04 LAB — RAPID URINE DRUG SCREEN, HOSP PERFORMED
Amphetamines: NOT DETECTED
Barbiturates: NOT DETECTED
Benzodiazepines: NOT DETECTED
Cocaine: POSITIVE — AB
Opiates: NOT DETECTED
Tetrahydrocannabinol: NOT DETECTED

## 2020-01-04 LAB — ACETAMINOPHEN LEVEL: Acetaminophen (Tylenol), Serum: <10 ug/mL — ABNORMAL LOW (ref 10–30)

## 2020-01-04 LAB — ETHANOL: Alcohol, Ethyl (B): <10 mg/dL (ref <10)

## 2020-01-04 MED ORDER — ALUM & MAG HYDROXIDE-SIMETH 200-200-20 MG/5ML PO SUSP
30.0000 mL | Freq: Four times a day (QID) | ORAL | Status: DC | PRN
  Administered 2020-01-04: 30 mL via ORAL
  Filled 2020-01-04: qty 30

## 2020-01-04 MED ORDER — THIAMINE HCL 100 MG/ML IJ SOLN: 100.0000 mg | Freq: Every day | INTRAMUSCULAR | Status: DC

## 2020-01-04 MED ORDER — LORAZEPAM 1 MG PO TABS
0.0000 mg | ORAL_TABLET | Freq: Four times a day (QID) | ORAL | Status: DC
  Administered 2020-01-04: 1 mg via ORAL
  Filled 2020-01-04: qty 1

## 2020-01-04 MED ORDER — LORAZEPAM 1 MG PO TABS: 0.0000 mg | ORAL_TABLET | Freq: Two times a day (BID) | ORAL | Status: DC

## 2020-01-04 MED ORDER — LORAZEPAM 2 MG/ML IJ SOLN: 0.0000 mg | Freq: Four times a day (QID) | INTRAMUSCULAR | Status: DC

## 2020-01-04 MED ORDER — LORAZEPAM 2 MG/ML IJ SOLN: 0.0000 mg | Freq: Two times a day (BID) | INTRAMUSCULAR | Status: DC

## 2020-01-04 MED ORDER — ACETAMINOPHEN 325 MG PO TABS: 650.0000 mg | ORAL_TABLET | ORAL | Status: DC | PRN

## 2020-01-04 MED ORDER — THIAMINE HCL 100 MG PO TABS
100.0000 mg | ORAL_TABLET | Freq: Every day | ORAL | Status: DC
  Administered 2020-01-04: 100 mg via ORAL
  Filled 2020-01-04: qty 1

## 2020-01-04 NOTE — ED Triage Notes (Addendum)
Pt was discharged yesterday, did not follow up with resources. Pt says he cant leave  the alcohol and drugs alone. He drank beer and did crack cocaine last night. States he is seeing people not there. Denies wanting to hurt self now, but states doesn't matter if he dead. Feels hopeless

## 2020-01-04 NOTE — Discharge Instructions (Signed)
Follow-up as instructed by behavioral health 
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
## 2020-01-04 NOTE — ED Notes (Signed)
Pt given bag of belongings prior to discharge- pt changed into his clothes

## 2020-01-04 NOTE — ED Provider Notes (Signed)
[facility] EMERGENCY DEPARTMENT Provider Note   CSN: [id number] Arrival date & time: 01/04/20  1025     History Chief Complaint  Patient presents with   V70.1    Garrett Black is a 59 y.o. male.  HPI   Patient presented to the emergency room with complaints of recurrent suicidal ideation. Patient has a history of alcohol and drug use. Was in the ED and just released yesterday for the same issue. Patient has been having issues with his girlfriend. He has been having a lot of stress. Patient was in the emergency room initially on the 20th. Patient was assessed by the psychiatry team and ultimately felt safe for discharge with outpatient follow-up yesterday afternoon. Patient states after leaving the hospital he ended up going home. When he was home there were drugs and alcohol available at the house. Patient started using drugs and drinking. He ended up staying out all night. Patient is feeling upset and depressed and suicidal. He has no specific plan but does not feel like he can manage on his own and he needs help. Past Medical History:  Diagnosis Date   Alcoholic (HCC)    Anxiety    Depression    GERD (gastroesophageal reflux disease)    Glaucoma    Hypertension    Prosthetic eye globe    L eye   Substance abuse (HCC)     There are no problems to display for this patient.   Past Surgical History:  Procedure Laterality Date   KNEE SURGERY Left 1980s   NOSE SURGERY  1970s   nose fracture   OCULAR PROSTHESIS REMOVAL Left 2017       Family History  Problem Relation Age of Onset   Cancer Mother    Hypertension Father     Social History   Tobacco Use   Smoking status: Current Every Day Smoker    Packs/day: 0.50    Years: 26.00    Pack years: 13.00    Types: Cigarettes   Smokeless tobacco: Never Used  Building services engineer Use: Never used  Substance Use Topics   Alcohol use: Yes    Comment: 24 oz beer every other day   Drug use: Yes     Types: Cocaine, "Crack" cocaine    Comment: crack last night    Home Medications Prior to Admission medications   Not on File    Allergies    Patient has no known allergies.  Review of Systems   Review of Systems  All other systems reviewed and are negative.   Physical Exam Updated Vital Signs BP (!) 140/92 (BP Location: Right Arm)    Pulse 81    Temp 98.6 F (37 C) (Oral)    Resp 16    Ht 1.803 m (5\' 11" )    Wt 79.8 kg    SpO2 100%    BMI 24.55 kg/m   Physical Exam Vitals and nursing note reviewed.  Constitutional:      General: He is not in acute distress.    Appearance: He is well-developed.  HENT:     Head: Normocephalic and atraumatic.     Right Ear: External ear normal.     Left Ear: External ear normal.  Eyes:     General: No scleral icterus.       Right eye: No discharge.        Left eye: No discharge.     Conjunctiva/sclera: Conjunctivae normal.  Neck:  Trachea: No tracheal deviation.  Cardiovascular:     Rate and Rhythm: Normal rate and regular rhythm.  Pulmonary:     Effort: Pulmonary effort is normal. No respiratory distress.     Breath sounds: Normal breath sounds. No stridor. No wheezing or rales.  Abdominal:     General: Bowel sounds are normal. There is no distension.     Palpations: Abdomen is soft.     Tenderness: There is no abdominal tenderness. There is no guarding or rebound.  Musculoskeletal:        General: No tenderness.     Cervical back: Neck supple.  Skin:    General: Skin is warm and dry.     Findings: No rash.  Neurological:     Mental Status: He is alert.     Cranial Nerves: No cranial nerve deficit (no facial droop, extraocular movements intact, no slurred speech).     Sensory: No sensory deficit.     Motor: No abnormal muscle tone or seizure activity.     Coordination: Coordination normal.  Psychiatric:        Mood and Affect: Mood is depressed. Affect is tearful.        Speech: Speech is not rapid and pressured or  slurred.        Behavior: Behavior is slowed and withdrawn. Behavior is not hyperactive or combative.        Thought Content: Thought content includes suicidal ideation. Thought content does not include suicidal plan.     ED Results / Procedures / Treatments   Labs (all labs ordered are listed, but only abnormal results are displayed) Labs Reviewed  COMPREHENSIVE METABOLIC PANEL - Abnormal; Notable for the following components:      Result Value   Glucose, Bld 155 (*)    All other components within normal limits  SALICYLATE LEVEL - Abnormal; Notable for the following components:   Salicylate Lvl <7.0 (*)    All other components within normal limits  ACETAMINOPHEN LEVEL - Abnormal; Notable for the following components:   Acetaminophen (Tylenol), Serum <10 (*)    All other components within normal limits  CBC - Abnormal; Notable for the following components:   RBC 6.24 (*)    MCH 24.0 (*)    MCHC 29.9 (*)    RDW 15.9 (*)    All other components within normal limits  RAPID URINE DRUG SCREEN, HOSP PERFORMED - Abnormal; Notable for the following components:   Cocaine POSITIVE (*)    All other components within normal limits  SARS CORONAVIRUS 2 BY RT PCR (HOSPITAL ORDER, PERFORMED IN [place] [facility] [facility])  ETHANOL    EKG EKG Interpretation  Date/Time:  Thursday January 04 2020 10:33:26 EDT Ventricular Rate:  74 PR Interval:  158 QRS Duration: 86 QT Interval:  356 QTC Calculation: 395 R Axis:   74 Text Interpretation: Normal sinus rhythm Normal ECG No old tracing to compare Confirmed by Linwood Dibbles [phone]) on 01/04/2020 10:43:26 AM   Radiology No results found.  Procedures Procedures (including critical care time)  Medications Ordered in ED Medications  LORazepam (ATIVAN) injection 0-4 mg ( Intravenous See Alternative 01/04/20 1216)    Or  LORazepam (ATIVAN) tablet 0-4 mg (1 mg Oral Given 01/04/20 1216)  LORazepam (ATIVAN) injection 0-4 mg (has no administration in  time range)    Or  LORazepam (ATIVAN) tablet 0-4 mg (has no administration in time range)  thiamine tablet 100 mg (100 mg Oral Given 01/04/20 1216)  Or  thiamine (B-1) injection 100 mg ( Intravenous See Alternative 01/04/20 1216)  acetaminophen (TYLENOL) tablet 650 mg (has no administration in time range)  alum & mag hydroxide-simeth (MAALOX/MYLANTA) 200-200-20 MG/5ML suspension 30 mL (30 mLs Oral Given 01/04/20 1216)    ED Course  I have reviewed the triage vital signs and the nursing notes.  Pertinent labs & imaging results that were available during my care of the patient were reviewed by me and considered in my medical decision making (see chart for details).  Clinical Course as of Jan 04 1327  Thu Jan 04, 2020  1123 Labs reviewed.  Notable for positive cocaine   [JK]    Clinical Course User Index [JK] Linwood Dibbles, MD   MDM Rules/Calculators/A&P                          Patient presented to the ED for evaluation of recurrent suicidal ideation.  Patient admits to recurrent alcohol and drug use last evening after leaving the hospital yesterday.  Patient did complain of some chest discomfort but EKG is reassuring.  I doubt acute cardiac event.  Patient is medically cleared for psychiatric evaluation. Final Clinical Impression(s) / ED Diagnoses Final diagnoses:  Suicidal ideation  Substance abuse [facility])    Rx / DC Orders ED Discharge Orders    None       Linwood Dibbles, MD 01/04/20 1329

## 2020-01-04 NOTE — ED Notes (Signed)
Pt wanded by security. 

## 2020-01-19 NOTE — Congregational Nurse Program (Signed)
  Dept: [phone]   Congregational Nurse Program Note  Date of Encounter: 01/15/2020  Past Medical History: Past Medical History:  Diagnosis Date  . Alcoholic (HCC)   . Anxiety   . Depression   . GERD (gastroesophageal reflux disease)   . Glaucoma   . Hypertension   . Prosthetic eye globe    L eye  . Substance abuse [facility])     Encounter Details:  CNP Questionnaire - 01/15/20 0943      Questionnaire   Do you give verbal consent to treat you today? Yes    Visit Setting Other   [facility] [place]   Location Patient Served At [employer], [employer]    Patient Status Not Applicable    Medical Provider No    Insurance Unknown;Uninsured (Includes [place] [facility] [place])    Intervention Assess (including screenings);Counsel;Support    Food Have food insecurities    Referrals Area Agency;PCP - other provider;[facility]           Client in to receive food at [employer]. Blood pressure obtained. 127/83, pulse 78. Client reports he is working now at [employer] that makes [employer]. He states he is to begin full time and unsure about insurance for medical.  Referred again to [facility] if not insured to complete application and referral to primary care provider. Client has also been referred in the past for mental health/SA services and client did not follow through.  Again discussed need to connect for mental health services and client acknowledges that need. Client given information for care connect with main line phone number and urged client to check about his insurance and if no insurance to call [facility] for assistance. Client reports understanding.   Francee Nodal RN [facility]

## 2020-01-23 ENCOUNTER — Emergency Department (HOSPITAL_COMMUNITY)
Admission: EM | Admit: 2020-01-23 | Discharge: 2020-01-24 | Disposition: A | Discharge status: Home / Self Care | Payer: Self-pay | Attending: Emergency Medicine | Admitting: Emergency Medicine

## 2020-01-23 ENCOUNTER — Other Ambulatory Visit: Payer: Self-pay

## 2020-01-23 ENCOUNTER — Encounter (HOSPITAL_COMMUNITY): Payer: Self-pay | Admitting: Emergency Medicine

## 2020-01-23 DIAGNOSIS — Z79899 Other long term (current) drug therapy: Secondary | ICD-10-CM | POA: Insufficient documentation

## 2020-01-23 DIAGNOSIS — R45851 Suicidal ideations: Secondary | ICD-10-CM | POA: Insufficient documentation

## 2020-01-23 DIAGNOSIS — F329 Major depressive disorder, single episode, unspecified: Secondary | ICD-10-CM | POA: Insufficient documentation

## 2020-01-23 DIAGNOSIS — I10 Essential (primary) hypertension: Secondary | ICD-10-CM | POA: Insufficient documentation

## 2020-01-23 DIAGNOSIS — F32A Depression, unspecified: Secondary | ICD-10-CM

## 2020-01-23 DIAGNOSIS — F17210 Nicotine dependence, cigarettes, uncomplicated: Secondary | ICD-10-CM | POA: Insufficient documentation

## 2020-01-23 LAB — RAPID URINE DRUG SCREEN, HOSP PERFORMED
Amphetamines: NOT DETECTED
Barbiturates: NOT DETECTED
Benzodiazepines: NOT DETECTED
Cocaine: POSITIVE — AB
Opiates: NOT DETECTED
Tetrahydrocannabinol: NOT DETECTED

## 2020-01-23 LAB — CBC WITH DIFFERENTIAL/PLATELET
Abs Immature Granulocytes: 0.02 10*3/uL (ref 0.00–0.07)
Basophils Absolute: 0 10*3/uL (ref 0.0–0.1)
Basophils Relative: 1 %
Eosinophils Absolute: 0.5 10*3/uL (ref 0.0–0.5)
Eosinophils Relative: 9 %
HCT: 46.1 % (ref 39.0–52.0)
Hemoglobin: 14.5 g/dL (ref 13.0–17.0)
Immature Granulocytes: 0 %
Lymphocytes Relative: 31 %
Lymphs Abs: 1.8 10*3/uL (ref 0.7–4.0)
MCH: 24.5 pg — ABNORMAL LOW (ref 26.0–34.0)
MCHC: 31.5 g/dL (ref 30.0–36.0)
MCV: 77.9 fL — ABNORMAL LOW (ref 80.0–100.0)
Monocytes Absolute: 0.4 10*3/uL (ref 0.1–1.0)
Monocytes Relative: 7 %
Neutro Abs: 3.1 10*3/uL (ref 1.7–7.7)
Neutrophils Relative %: 52 %
Platelets: 285 10*3/uL (ref 150–400)
RBC: 5.92 MIL/uL — ABNORMAL HIGH (ref 4.22–5.81)
RDW: 15.6 % — ABNORMAL HIGH (ref 11.5–15.5)
WBC: 6 10*3/uL (ref 4.0–10.5)
nRBC: 0 % (ref 0.0–0.2)

## 2020-01-23 LAB — COMPREHENSIVE METABOLIC PANEL
ALT: 26 U/L (ref 0–44)
AST: 35 U/L (ref 15–41)
Albumin: 4.1 g/dL (ref 3.5–5.0)
Alkaline Phosphatase: 43 U/L (ref 38–126)
Anion gap: 8 (ref 5–15)
BUN: 10 mg/dL (ref 6–20)
CO2: 27 mmol/L (ref 22–32)
Calcium: 9.1 mg/dL (ref 8.9–10.3)
Chloride: 107 mmol/L (ref 98–111)
Creatinine, Ser: 1.05 mg/dL (ref 0.61–1.24)
GFR, Estimated: >60 mL/min (ref >60)
Glucose, Bld: 96 mg/dL (ref 70–99)
Potassium: 4.3 mmol/L (ref 3.5–5.1)
Sodium: 142 mmol/L (ref 135–145)
Total Bilirubin: 0.9 mg/dL (ref 0.3–1.2)
Total Protein: 7.1 g/dL (ref 6.5–8.1)

## 2020-01-23 LAB — ETHANOL: Alcohol, Ethyl (B): <10 mg/dL (ref <10)

## 2020-01-23 NOTE — ED Triage Notes (Signed)
Pt arrives with [employer] after calling 911 saying he was going to kill himself. Pt states he has been drinking alcohol tonight and using multiple drugs. States he was asleep and woke up with "crazy thoughts" and wanted to hurt himself. Denies injury to self at this time.

## 2020-01-23 NOTE — BH Assessment (Signed)
Comprehensive Clinical Assessment (CCA) Screening, Triage and Referral Note  01/23/2020 Gagan L. Scorsone [id number]   Garrett Black is a 59 year old male presenting voluntarily to [facility] with SI with plan to stab himself with a knife. Patient reported calling 911 after drinking alcohol and using marijuana and crack, falling asleep and waking up with "crazy thoughts" and feeling suicidal. Patient reported having a knife beside him and when the police arrived they took it away. Patient reported feeling suicidal since 1990's, however onset of this SI episode continues to worsen. Patient reported worsening depressive symptoms. Patient reported history of suicide attempts, including 1994 attempted to hang self and 1998 jumping out of a moving car. Patient reported inpatient psych treatment in the "1990s". Patient reported using marijuana, cocaine and alcohol today. Patient reported drinking 6 beers daily, $100.00 worth of crack and about x2 hits of marijuana. Patient admitted to a 2 year history of crack cocaine use and alcohol use. Patient stated," If I have the money, I would use more." Patient reported 5-6 hours sleep and poor appetite. Patient resides with girlfriend. Patient denied access to guns. Patient is currently employed with little work-related stressors. Patient was cooperative during assessment and requesting inpatient treatment as he is not able to contract for safety.  Disposition [name], NP, patient meets inpatient criteria. [name], NP, patient meets inpatient criteria. AC, reviewing for placement.   Visit Diagnosis: Major depressive disorder and Polysubstance Abuse  Patient Reported Information How did you hear about [place]? Self   Referral name: No data recorded  Referral phone number: No data recorded Whom do you see for routine medical problems? I don't have a doctor   Practice/Facility Name: No data recorded  Practice/Facility Phone Number: No data recorded  Name of Contact: No  data recorded  Contact Number: No data recorded  Contact Fax Number: No data recorded  Prescriber Name: No data recorded  Prescriber Address (if known): No data recorded What Is the Reason for Your Visit/Call Today? SI with plan to stab self with knife.  How Long Has This Been Causing You Problems? > than 6 months  Have You Recently Been in Any Inpatient Treatment (Hospital/Detox/Crisis Center/28-Day Program)? No   Name/Location of Program/Hospital:No data recorded  How Long Were You There? No data recorded  When Were You Discharged? No data recorded Have You Ever Received Services From [facility] Before? No   Who Do You See at [facility]? No data recorded Have You Recently Had Any Thoughts About Hurting Yourself? No   Are You Planning to Commit Suicide/Harm Yourself At This time?  Yes  Have you Recently Had Thoughts About Hurting Someone Karolee Ohs? No   Explanation: No data recorded Have You Used Any Alcohol or Drugs in the Past 24 Hours? Yes   How Long Ago Did You Use Drugs or Alcohol?  1000   What Did You Use and How Much? alcohol, crack and marijuana  What Do You Feel Would Help You the Most Today? Other (Comment) (inpatient treatment)  Do You Currently Have a Therapist/Psychiatrist? Yes   Name of Therapist/Psychiatrist: Darvin Neighbours   Have You Been Recently Discharged From Any Office Practice or Programs? No   Explanation of Discharge From Practice/Program:  No data recorded    CCA Screening Triage Referral Assessment Type of Contact: Tele-Assessment   Is this Initial or Reassessment? Initial Assessment   Date Telepsych consult ordered in [facility]:  01/23/20   Time Telepsych consult ordered in [facility]:  1031  Patient Reported Information Reviewed? Yes   Patient Left Without Being Seen? No data recorded  Reason for Not Completing Assessment: No data recorded Collateral Involvement: none reported  Does Patient Have a Court Appointed Legal Guardian? No data  recorded  Name and Contact of Legal Guardian:  No data recorded If Minor and Not Living with Parent(s), Who has Custody? No data recorded Is CPS involved or ever been involved? Never  Is APS involved or ever been involved? Never  Patient Determined To Be At Risk for Harm To Self or Others Based on Review of Patient Reported Information or Presenting Complaint? Yes, for Self-Harm   Method: No data recorded  Availability of Means: No data recorded  Intent: No data recorded  Notification Required: No data recorded  Additional Information for Danger to Others Potential:  No data recorded  Additional Comments for Danger to Others Potential:  No data recorded  Are There Guns or Other Weapons in Your Home?  No data recorded   Types of Guns/Weapons: No data recorded   Are These Weapons Safely Secured?                              No data recorded   Who Could Verify You Are Able To Have These Secured:    No data recorded Do You Have any Outstanding Charges, Pending Court Dates, Parole/Probation? No data recorded Contacted To Inform of Risk of Harm To Self or Others: No data recorded Location of Assessment: [facility]  Does Patient Present under Involuntary Commitment? No   IVC Papers Initial File Date: No data recorded  [place] of Residence: [place]  Patient Currently Receiving the Following Services: Individual Therapy   Determination of Need: Emergent (2 hours)   Options For Referral: Inpatient Hospitalization;Medication Management;Intensive Outpatient Therapy   Burnetta Sabin, [facility]

## 2020-01-23 NOTE — ED Notes (Signed)
[name], NP, patient meets inpatient criteria. AC, reviewing for placement.

## 2020-01-23 NOTE — ED Provider Notes (Addendum)
[facility] EMERGENCY DEPARTMENT Provider Note   CSN: [id number] Arrival date & time: 01/23/20  2536     History Chief Complaint  Patient presents with  . Suicidal    Tyron L. Lundstrom is a 59 y.o. male.  SI/'crazy thoughts' doesn't follow up with outpatient when instructed.    Mental Health Problem Presenting symptoms: agitation and suicidal thoughts   Degree of incapacity (severity):  Mild Timing:  Constant Chronicity:  Recurrent Context: alcohol use and drug abuse   Context: not noncompliant   Relieved by:  None tried Worsened by:  Nothing Ineffective treatments:  None tried      Past Medical History:  Diagnosis Date  . Alcoholic (HCC)   . Anxiety   . Depression   . GERD (gastroesophageal reflux disease)   . Glaucoma   . Hypertension   . Prosthetic eye globe    L eye  . Substance abuse (HCC)     There are no problems to display for this patient.   Past Surgical History:  Procedure Laterality Date  . KNEE SURGERY Left 1980s  . NOSE SURGERY  1970s   nose fracture  . OCULAR PROSTHESIS REMOVAL Left 2017       Family History  Problem Relation Age of Onset  . Cancer Mother   . Hypertension Father     Social History   Tobacco Use  . Smoking status: Current Every Day Smoker    Packs/day: 0.50    Years: 26.00    Pack years: 13.00    Types: Cigarettes  . Smokeless tobacco: Never Used  Vaping Use  . Vaping Use: Never used  Substance Use Topics  . Alcohol use: Yes    Comment: 24 oz beer every other day  . Drug use: Yes    Types: Cocaine, "Crack" cocaine    Comment: crack last night    Home Medications Prior to Admission medications   Medication Sig Start Date End Date Taking? Authorizing Provider  omeprazole (PRILOSEC) 10 MG capsule Take 10 mg by mouth daily.    [provider]    Allergies    Patient has no known allergies.  Review of Systems   Review of Systems  Psychiatric/Behavioral: Positive for agitation and suicidal  ideas.  All other systems reviewed and are negative.   Physical Exam Updated Vital Signs BP (!) 133/91 (BP Location: Right Arm)   Pulse 73   Temp 97.8 F (36.6 C) (Oral)   Resp 18   Ht 5\' 11"  (1.803 m)   Wt 79.4 kg   SpO2 99%   BMI 24.41 kg/m   Physical Exam Vitals and nursing note reviewed.  Constitutional:      Appearance: He is well-developed.  HENT:     Head: Normocephalic and atraumatic.     Nose: Nose normal. No congestion or rhinorrhea.     Mouth/Throat:     Mouth: Mucous membranes are moist.     Pharynx: Oropharynx is clear.  Eyes:     Pupils: Pupils are equal, round, and reactive to light.  Cardiovascular:     Rate and Rhythm: Normal rate.  Pulmonary:     Effort: Pulmonary effort is normal. No respiratory distress.  Abdominal:     General: Abdomen is flat. There is no distension.  Musculoskeletal:        General: No swelling or tenderness. Normal range of motion.     Cervical back: Normal range of motion.  Skin:    General: Skin is warm  and dry.     Coloration: Skin is not jaundiced or pale.  Neurological:     General: No focal deficit present.     Mental Status: He is alert.     ED Results / Procedures / Treatments   Labs (all labs ordered are listed, but only abnormal results are displayed) Labs Reviewed  RESPIRATORY PANEL BY RT PCR (FLU A&B, COVID)  COMPREHENSIVE METABOLIC PANEL  ETHANOL  RAPID URINE DRUG SCREEN, HOSP PERFORMED  CBC WITH DIFFERENTIAL/PLATELET    EKG None  Radiology No results found.  Procedures Procedures (including critical care time)  Medications Ordered in ED Medications - No data to display  ED Course  I have reviewed the triage vital signs and the nursing notes.  Pertinent labs & imaging results that were available during my care of the patient were reviewed by me and considered in my medical decision making (see chart for details).    MDM Rules/Calculators/A&P                         Will medically clear.  Observe. TTS consultation.   No indication for IVC right now as he is here on his own and wants help.   Final Clinical Impression(s) / ED Diagnoses Final diagnoses:  None    Rx / DC Orders ED Discharge Orders    None       Estephania Licciardi, Barbara Cower, MD 01/23/20 [phone]

## 2020-01-24 ENCOUNTER — Encounter (HOSPITAL_COMMUNITY): Payer: Self-pay | Admitting: Emergency Medicine

## 2020-01-24 NOTE — Progress Notes (Signed)
Patient ID: Garrett Black, male   DOB: 08/29/1960, 59 y.o.   MRN: [id number]  Psychiatric Reassessment   HPI: Garrett Black is a 59 year old male presenting voluntarily to [facility] with SI with plan to stab himself with a knife. Patient reported calling 911 after drinking alcohol and using marijuana and crack, falling asleep and waking up with "crazy thoughts" and feeling suicidal. Patient reported having a knife beside him and when the police arrived they took it away. Patient reported feeling suicidal since 1990's, however onset of this SI episode continues to worsen. Patient reported worsening depressive symptoms. Patient reported history of suicide attempts, including 1994 attempted to hang self and 1998 jumping out of a moving car. Patient reported inpatient psych treatment in the "1990s". Patient reported using marijuana, cocaine and alcohol today. Patient reported drinking 6 beers daily, $100.00 worth of crack and about x2 hits of marijuana. Patient admitted to a 2 year history of crack cocaine use and alcohol use. Patient stated," If I have the money, I would use more." Patient reported 5-6 hours sleep and poor appetite. Patient resides with girlfriend. Patient denied access to guns. Patient is currently employed with little work-related stressors. Patient was cooperative during assessment and requesting inpatient treatment as he is not able to contract for safety.   Psychiatric Evaluation: Mr. Garrett Black is a 59 year-old male who presented to [facility] with chief complaint of suicidal thoughts. Patient is well known to the behavioral health system as he has multiple psychiatric evaluations. He was recently evaluated by [facility] 01/04/2020 and 01/01/20 after he presented with similar presentations. His psychiatric history is significant for depression, anxiety and polysubstance abuse. He commonly presents with reports of SI and worsening depression often related to his substance addictions. During this evaluation, he is  alert and oriented x4, calm and cooperative. He stated," I am feeling better now but I came here yesterday because I was just feeling stressed and tired of my drug use." He denied current SI, HI and psychosis. He admitted to using crack anonaine and drinking alcohol with last use three days ago however, these addictions are chronic. He denied other substance abuse or use.   He stated," after I left here the last time, you'll gave me resources and I didn't follow-up, I have no excuse." Added." but this time, I am ready. I am ready to do something with my life and get off these drugs cause its only making things worse." He denied access to firearms.  Stated there is a psychiatrist who lives close to his home and he has been communicating with er hoping to establish services. He has had no recent psychiatric hospitalization reporting his last admission was in the "1990s". He has had past suicide attempts, pr self-report, but stated his last attempt was, again, in the 1990's. At this time, he is requesting substance abuse services.   Disposition: Patient denies current SI, HI and psychosis. Although he has reported hallucinations in the past, he does not appear internally preoccupied. He has a long history of polysubstance abuse and at this time, he is requesting outpatient psychiatric services for substance abuse. There is no evidence of imminent risk to self or others at present as  Such, patient is psychiatrically cleared. I spoke to CSW to discuss disposition and have requested that resources be provided for substance abuse services. ..    ED updated on disposition

## 2020-01-24 NOTE — Discharge Instructions (Signed)
Residential Treatment  Facilities Medicaid Detox No Insurance Engineer, site ([facility]) [address] [place], [place] [phone] or  [phone]   No  Yes  Yes  Yes    [facility] [phone] [address] [place], [place] [address] [phone] Admissions: 8am-3pm  M-F   [place] only  No  Yes  No    [facility] [phone]   No  Yes  No- out of pocket 16,000  Yes   [facility] ([facility]) [address] [place], [place] [phone]   Yes- No medicare  Yes   Yes, [place]   [address] only    [address] [place], [place], [address] [phone]            No  No  Yes but private pay, offers some sponsorships  Does not take insurance   Residential Treatment  Facilities Medicaid Detox No Insurance Private Insurance   [facility]  [facility] [place], [place] [phone] (takes everyone as long as they meet detox criteria)   Yes  Yes   Yes  Yes   [address] [place], [place]  [phone] 27 locations    No  No- sober living house  90.00-130.00 per week per person  [facility] [place] Part of [place] Outreach [phone] [email or web address] .[facility] Part of [place] Outreach [phone] [email or web address]    No   [facility] [address]  [place] [phone] [email or web address]   No  No  1,200.00 a 200.00 deposit is required at start of treatment Payment plans accepted [facility] based program  No   [facility] [address]  [place], [place], [address] [phone]        No  Yes       Yes  Regular Rehab: 7,500.00 28 days Dual Diagnosis: 8,900.00 28 days 7 day detox: 2.700.00 or 3,400.00 for Dual Diagnosis     Yes   Residential Treatment  Facilities *out of state     Medicaid  Detox  No Insurance  Private Insurance   [facility] *out of state facilities [phone]   No  Yes  Private Pay  Yes    [facility] *out of state facilities  [phone]   No  Yes  Private Pay   Yes                  Outpatient Treatment  Facilities Medicaid Detox No Insurance Private  Insurance   [facility] [address]  [place], [place], [address] [phone]   No  No  No  Yes    [facility] Partial Hospitalization [facility]  (If substance abuse is secondary diagnosis) [address],  [place], [place] [address] [phone]   Yes-[place] Only for Partial   No   No   Yes- IOP   [facility] [address]  [place], [place], [address] [phone]   Yes  They would go to ER at [facility] then be transferred to a detox unit    Yes- self pay     Yes    [facility]  [address]  [place], [place], [address] And  [address],  [place], [place] [address] [phone]           Yes  No  °  °Yes most qualify for state funding ° °IOP and  °Opiod treatment- Offers Methadone ° °  °UHC, [employer] BCBS, [place]  °Outpatient Treatment  °Facilities Medicaid Detox No Insurance Private  °Insurance  °[facility] °[address] °[place], [place], [address] °[phone] °(suboxone) ° YES No Private Pay Medicaid only  ° °[facility] °[address] °[place], [place],  °[phone] °  °Yes but not for suboxone treatment  °Yes- opiates with suboxone have to commit to 8 week IOP °  °Yes °595.00 for first visit  °150.00 for prescription °150.00 a week after that for group °   °Yes °  ° °[facility] °[address]  °[place], [place] °[phone] ° No- has a waiting list about to be approved No Yes- but has to be self pay  °500.00 for 1st 2 weeks  °500 for next 2 weeks and  °750 mo.  Ongoing Yes   ° °[facility] °[address]  °[address] °[place], [place] °[phone] ° No No Limited sponsorships °IOP- 9,500.00 °8-15 weeks °If paid upfront gives a 500.00 deduction °Outpatient- 1 day a week  °9 weeks 4,500.00 has payment plans  ° Yes- out of network though  ° °[facility] (Groups/Residential) °[place], [place]  °[phone] ° Yes- [place] No IOP facility  Yes  No  °Outpatient Treatment  °Facilities Medicaid Detox No Insurance Private  °Insurance  ° °[facility]  °[address]  °[place], [place] [address] °[phone] °  °Yes   °No  °  °Yes   ° ° °

## 2020-01-24 NOTE — Progress Notes (Signed)
Pt has been psychiatrically cleared. Substance use/mental health resources placed in pt's AVS.    Wells Guiles, MSW, LCSW, LCAS Clinical Social Worker II Disposition CSW [phone]

## 2020-10-28 ENCOUNTER — Other Ambulatory Visit: Payer: Self-pay

## 2020-10-28 ENCOUNTER — Emergency Department (HOSPITAL_COMMUNITY): Payer: Medicaid Other

## 2020-10-28 ENCOUNTER — Emergency Department (HOSPITAL_COMMUNITY)
Admission: EM | Admit: 2020-10-28 | Discharge: 2020-10-28 | Disposition: A | Discharge status: Home / Self Care | Payer: Medicaid Other | Attending: Emergency Medicine | Admitting: Emergency Medicine

## 2020-10-28 ENCOUNTER — Encounter (HOSPITAL_COMMUNITY): Payer: Self-pay

## 2020-10-28 DIAGNOSIS — R079 Chest pain, unspecified: Secondary | ICD-10-CM | POA: Diagnosis not present

## 2020-10-28 DIAGNOSIS — Z20822 Contact with and (suspected) exposure to covid-19: Secondary | ICD-10-CM | POA: Insufficient documentation

## 2020-10-28 DIAGNOSIS — M545 Low back pain, unspecified: Secondary | ICD-10-CM | POA: Diagnosis not present

## 2020-10-28 DIAGNOSIS — M25561 Pain in right knee: Secondary | ICD-10-CM | POA: Diagnosis not present

## 2020-10-28 DIAGNOSIS — M25511 Pain in right shoulder: Secondary | ICD-10-CM | POA: Insufficient documentation

## 2020-10-28 DIAGNOSIS — W19XXXA Unspecified fall, initial encounter: Secondary | ICD-10-CM | POA: Diagnosis not present

## 2020-10-28 DIAGNOSIS — S3991XA Unspecified injury of abdomen, initial encounter: Secondary | ICD-10-CM | POA: Diagnosis present

## 2020-10-28 DIAGNOSIS — F17210 Nicotine dependence, cigarettes, uncomplicated: Secondary | ICD-10-CM | POA: Diagnosis not present

## 2020-10-28 DIAGNOSIS — T1490XA Injury, unspecified, initial encounter: Secondary | ICD-10-CM | POA: Diagnosis not present

## 2020-10-28 DIAGNOSIS — R748 Abnormal levels of other serum enzymes: Secondary | ICD-10-CM | POA: Insufficient documentation

## 2020-10-28 DIAGNOSIS — R519 Headache, unspecified: Secondary | ICD-10-CM | POA: Diagnosis not present

## 2020-10-28 DIAGNOSIS — I10 Essential (primary) hypertension: Secondary | ICD-10-CM | POA: Insufficient documentation

## 2020-10-28 DIAGNOSIS — M25562 Pain in left knee: Secondary | ICD-10-CM | POA: Insufficient documentation

## 2020-10-28 LAB — URINALYSIS, ROUTINE W REFLEX MICROSCOPIC
Bacteria, UA: NONE SEEN
Bilirubin Urine: NEGATIVE
Glucose, UA: NEGATIVE mg/dL
Ketones, ur: 20 mg/dL — AB
Leukocytes,Ua: NEGATIVE
Nitrite: NEGATIVE
Protein, ur: NEGATIVE mg/dL
Specific Gravity, Urine: >1.046 — ABNORMAL HIGH (ref 1.005–1.030)
pH: 6 (ref 5.0–8.0)

## 2020-10-28 LAB — CBC WITH DIFFERENTIAL/PLATELET
Abs Immature Granulocytes: 0.05 10*3/uL (ref 0.00–0.07)
Basophils Absolute: 0.1 10*3/uL (ref 0.0–0.1)
Basophils Relative: 1 %
Eosinophils Absolute: 0.2 10*3/uL (ref 0.0–0.5)
Eosinophils Relative: 2 %
HCT: 49.7 % (ref 39.0–52.0)
Hemoglobin: 15.7 g/dL (ref 13.0–17.0)
Immature Granulocytes: 0 %
Lymphocytes Relative: 16 %
Lymphs Abs: 1.8 10*3/uL (ref 0.7–4.0)
MCH: 24.5 pg — ABNORMAL LOW (ref 26.0–34.0)
MCHC: 31.6 g/dL (ref 30.0–36.0)
MCV: 77.5 fL — ABNORMAL LOW (ref 80.0–100.0)
Monocytes Absolute: 0.6 10*3/uL (ref 0.1–1.0)
Monocytes Relative: 5 %
Neutro Abs: 8.7 10*3/uL — ABNORMAL HIGH (ref 1.7–7.7)
Neutrophils Relative %: 76 %
Platelets: 263 10*3/uL (ref 150–400)
RBC: 6.41 MIL/uL — ABNORMAL HIGH (ref 4.22–5.81)
RDW: 17.4 % — ABNORMAL HIGH (ref 11.5–15.5)
WBC: 11.4 10*3/uL — ABNORMAL HIGH (ref 4.0–10.5)
nRBC: 0 % (ref 0.0–0.2)

## 2020-10-28 LAB — COMPREHENSIVE METABOLIC PANEL
ALT: 25 U/L (ref 0–44)
AST: 49 U/L — ABNORMAL HIGH (ref 15–41)
Albumin: 5.1 g/dL — ABNORMAL HIGH (ref 3.5–5.0)
Alkaline Phosphatase: 68 U/L (ref 38–126)
Anion gap: 12 (ref 5–15)
BUN: 12 mg/dL (ref 6–20)
CO2: 24 mmol/L (ref 22–32)
Calcium: 9.2 mg/dL (ref 8.9–10.3)
Chloride: 105 mmol/L (ref 98–111)
Creatinine, Ser: 1.32 mg/dL — ABNORMAL HIGH (ref 0.61–1.24)
GFR, Estimated: >60 mL/min (ref >60)
Glucose, Bld: 64 mg/dL — ABNORMAL LOW (ref 70–99)
Potassium: 3 mmol/L — ABNORMAL LOW (ref 3.5–5.1)
Sodium: 141 mmol/L (ref 135–145)
Total Bilirubin: 1.6 mg/dL — ABNORMAL HIGH (ref 0.3–1.2)
Total Protein: 8.6 g/dL — ABNORMAL HIGH (ref 6.5–8.1)

## 2020-10-28 LAB — CK
Total CK: 1984 U/L — ABNORMAL HIGH (ref 49–397)
Total CK: 1993 U/L — ABNORMAL HIGH (ref 49–397)

## 2020-10-28 LAB — ETHANOL: Alcohol, Ethyl (B): <10 mg/dL (ref <10)

## 2020-10-28 LAB — CBG MONITORING, ED
Glucose-Capillary: 63 mg/dL — ABNORMAL LOW (ref 70–99)
Glucose-Capillary: 73 mg/dL (ref 70–99)

## 2020-10-28 LAB — LACTIC ACID, PLASMA: Lactic Acid, Venous: 1.1 mmol/L (ref 0.5–1.9)

## 2020-10-28 MED ORDER — IOHEXOL 300 MG/ML  SOLN
75.0000 mL | Freq: Once | INTRAMUSCULAR | Status: AC | PRN
  Administered 2020-10-28: 100 mL via INTRAVENOUS

## 2020-10-28 MED ORDER — SODIUM CHLORIDE 0.9 % IV BOLUS
500.0000 mL | Freq: Once | INTRAVENOUS | Status: AC
  Administered 2020-10-28: 500 mL via INTRAVENOUS

## 2020-10-28 MED ORDER — HYDROMORPHONE HCL 1 MG/ML IJ SOLN
0.5000 mg | Freq: Once | INTRAMUSCULAR | Status: AC
  Administered 2020-10-28: 0.5 mg via INTRAVENOUS
  Filled 2020-10-28: qty 1

## 2020-10-28 MED ORDER — POTASSIUM CHLORIDE 10 MEQ/100ML IV SOLN
10.0000 meq | Freq: Once | INTRAVENOUS | Status: AC
  Administered 2020-10-28: 10 meq via INTRAVENOUS
  Filled 2020-10-28: qty 100

## 2020-10-28 MED ORDER — SODIUM CHLORIDE 0.9 % IV BOLUS
1000.0000 mL | Freq: Once | INTRAVENOUS | Status: AC
  Administered 2020-10-28: 1000 mL via INTRAVENOUS

## 2020-10-28 MED ORDER — SODIUM CHLORIDE 0.9 % IV BOLUS: 1000.0000 mL | Freq: Once | INTRAVENOUS | Status: DC

## 2020-10-28 MED ORDER — DEXTROSE 50 % IV SOLN
25.0000 mL | Freq: Once | INTRAVENOUS | Status: AC
  Administered 2020-10-28: 25 mL via INTRAVENOUS
  Filled 2020-10-28: qty 50

## 2020-10-28 NOTE — Discharge Instructions (Addendum)
Take Tylenol for pain.  Drink plenty of fluids.  Follow-up with your family doctor in 2 to 3 days for recheck

## 2020-10-28 NOTE — ED Notes (Signed)
Asked the PT's visitor to go back into room she stated " I am waiting to talk to his nurse" I told her we could not have visitor's in the hall, then pt requests door to stay open.Pt comes back out and says the PT kicked her out so she is sitting in the family room and is requesting records. Stating he gets in a mood when he does not have his medication.

## 2020-10-28 NOTE — ED Provider Notes (Signed)
Patient with a story of a fall of possibly 1 story when he stepped into a building.  Plain films and CTs are all unremarkable lab work does show mild elevation in CK.  Patient was dehydrated and has been given 3 L of saline.  He will follow-up with his primary care doctor in 2 to 3 days for recheck   Bethann Berkshire, MD 10/28/20 1345

## 2020-10-28 NOTE — ED Triage Notes (Signed)
Pt arrived via [facility] found on the ground in front of [place] [employer] [place]. It is reported Pt was walking through a building and stepped through a doorway that did not have a floor on the other side. Pt reports falling an unknown  distance. Pt reports calling for help and ultimately ended up crawling apprx 2 blocks to get to the police station. Pt c/o pain all over with limited movement in X4 extremities.

## 2020-10-28 NOTE — ED Provider Notes (Signed)
[facility] EMERGENCY DEPARTMENT Provider Note   CSN: [id number] Arrival date & time: 10/28/20  8841     History Chief Complaint  Patient presents with   Garrett Black is a 60 y.o. male.  Patient story is difficult to obtain as the changes with EMS and nursing and me.  What is consistent is that he stepped through a door in a building and there is no floor on the other side.  He fell down.  He is not sure how he landed.  Initially it was told that he crawled to a [employer] for help.  He tells me that he stacked up some boxes and over a couple different times to try to get out of the hole that he was in and then will crawl to the [employer] for help.  He complains of right shoulder, back, chest, bilateral leg pain worse around the knees.  No blood thinners.  Does not think he passed out but does have a headache.   Fall      Past Medical History:  Diagnosis Date   Alcoholic (HCC)    Anxiety    Depression    GERD (gastroesophageal reflux disease)    Glaucoma    Hypertension    Prosthetic eye globe    L eye   Substance abuse (HCC)     There are no problems to display for this patient.   Past Surgical History:  Procedure Laterality Date   KNEE SURGERY Left 1980s   NOSE SURGERY  1970s   nose fracture   OCULAR PROSTHESIS REMOVAL Left 2017       Family History  Problem Relation Age of Onset   Cancer Mother    Hypertension Father     Social History   Tobacco Use   Smoking status: Every Day    Packs/day: 0.50    Years: 26.00    Pack years: 13.00    Types: Cigarettes   Smokeless tobacco: Never  Vaping Use   Vaping Use: Never used  Substance Use Topics   Alcohol use: Yes    Comment: 24 oz beer every other day   Drug use: Not Currently    Types: Cocaine, "Crack" cocaine    Home Medications Prior to Admission medications   Medication Sig Start Date End Date Taking? Authorizing Provider  ibuprofen (ADVIL) 100 MG tablet Take 100 mg by mouth  every 6 (six) hours as needed for fever.    [provider]    Allergies    Patient has no known allergies.  Review of Systems   Review of Systems  All other systems reviewed and are negative.  Physical Exam Updated Vital Signs BP (!) 142/101   Pulse 68   Temp 97.6 F (36.4 C) (Oral)   Resp 14   Ht 5\' 11"  (1.803 m)   Wt 80 kg   SpO2 100%   BMI 24.60 kg/m   Physical Exam Vitals and nursing note reviewed.  Constitutional:      Appearance: He is well-developed.  HENT:     Head: Normocephalic and atraumatic.     Nose: Nose normal. No congestion or rhinorrhea.     Mouth/Throat:     Mouth: Mucous membranes are moist.     Pharynx: Oropharynx is clear.  Eyes:     Pupils: Pupils are equal, round, and reactive to light.  Cardiovascular:     Rate and Rhythm: Normal rate.  Pulmonary:     Effort: Pulmonary  effort is normal. No respiratory distress.  Abdominal:     General: There is no distension.  Musculoskeletal:        General: Tenderness (Bilateral femurs near the knee, right greater than left humerus, anterior chest, throughout most of his back.) present. Normal range of motion.     Cervical back: Normal range of motion.  Skin:    General: Skin is warm and dry.  Neurological:     General: No focal deficit present.     Mental Status: He is alert.     Comments: Patient is able to squeeze my hands bilaterally and has sensation to light touch in bilateral upper extremities.  He is able to wiggle his toes bilaterally and a sensation to light touch in bilateral lower extremities.    ED Results / Procedures / Treatments   Labs (all labs ordered are listed, but only abnormal results are displayed) Labs Reviewed  SARS CORONAVIRUS 2 (TAT 6-24 HRS)  CBC WITH DIFFERENTIAL/PLATELET  COMPREHENSIVE METABOLIC PANEL  LACTIC ACID, PLASMA  LACTIC ACID, PLASMA  CK  URINALYSIS, ROUTINE W REFLEX MICROSCOPIC    EKG None  Radiology No results  found.  Procedures Procedures   Medications Ordered in ED Medications  HYDROmorphone (DILAUDID) injection 0.5 mg (has no administration in time range)    ED Course  I have reviewed the triage vital signs and the nursing notes.  Pertinent labs & imaging results that were available during my care of the patient were reviewed by me and considered in my medical decision making (see chart for details).    MDM Rules/Calculators/A&P                          Eval for trauamtic injury. Also with the report of lying there hurt for around 4 hours will eval fro rhabdo. Pain meds ordered.   Care transferred pending lab/imaging results and ultimate disposition.   Final Clinical Impression(s) / ED Diagnoses Final diagnoses:  Trauma    Rx / DC Orders ED Discharge Orders     None        Sylvan Lahm, Barbara Cower, MD 10/29/20 2318

## 2020-10-28 NOTE — ED Notes (Signed)
ED Provider at bedside. 

## 2020-10-28 NOTE — ED Notes (Signed)
Pt's visitor stated for the nurse to call her back later with information, stated she was a emergency contact, (we do not have her listed). Also stated she was the 'health care advocate" I left her name and number with the nurse.

## 2020-10-28 NOTE — ED Notes (Signed)
Pt would not allow nurse to call anyone to pick him up at this time. States he is walking to the police dept to see if they will look forhis phone for him. Offered to call Aggie Cosier, his visitor from earlier, but he does not want me to call or speak with her at this time per his request

## 2020-10-28 NOTE — ED Notes (Signed)
Pt contact Lowella Bandy notified Pt is at [facility]. Pt contact reached at [phone].

## 2020-10-28 NOTE — ED Notes (Signed)
Pt to ct and xray

## 2020-10-29 LAB — SARS CORONAVIRUS 2 (TAT 6-24 HRS): SARS Coronavirus 2: NEGATIVE

## 2020-12-27 ENCOUNTER — Ambulatory Visit
Admission: EM | Admit: 2020-12-27 | Discharge: 2020-12-27 | Disposition: A | Discharge status: Home / Self Care | Payer: Medicaid Other | Attending: Emergency Medicine | Admitting: Emergency Medicine

## 2020-12-27 ENCOUNTER — Encounter: Payer: Self-pay | Admitting: Emergency Medicine

## 2020-12-27 ENCOUNTER — Other Ambulatory Visit: Payer: Self-pay

## 2020-12-27 DIAGNOSIS — Z760 Encounter for issue of repeat prescription: Secondary | ICD-10-CM | POA: Diagnosis not present

## 2020-12-27 DIAGNOSIS — R030 Elevated blood-pressure reading, without diagnosis of hypertension: Secondary | ICD-10-CM

## 2020-12-27 MED ORDER — LISINOPRIL 5 MG PO TABS: 5.0000 mg | ORAL_TABLET | Freq: Every day | ORAL | 0 refills | Status: AC

## 2020-12-27 NOTE — Discharge Instructions (Signed)
Lisinopril 5 mg refilled Please continue to monitor blood pressure at home and keep a log Eat a well balanced diet of fruits, vegetables and lean meats.  Avoid foods high in fat and salt Drink water.  At least half your body weight in ounces Exercise for at least 30 minutes daily Follow up with PCP Return or go to the ED if you have any new or worsening symptoms such as vision changes, fatigue, dizziness, chest pain, shortness of breath, nausea, swelling in your hands or feet, urinary symptoms, etc..Marland Kitchen

## 2020-12-27 NOTE — ED Triage Notes (Signed)
Needs refill on 5mg  lisinopril daily

## 2020-12-27 NOTE — ED Provider Notes (Signed)
[facility]   [id number] 12/27/20 Arrival Time: 1335  CC: Medication refill  SUBJECTIVE:  Garrett Black is a 60 y.o. male who presents for blood pressure medication refill.  Hx of HTN x 2 years.  States blood pressure in office of 153/89.  Takes lisinopril  5mg  daily.  Does not have a PCP.  Denies HA, vision changes, dizziness, lightheadedness, chest pain, shortness of breath, numbness or tingling in extremities, abdominal pain, changes in bowel or bladder habits.      ROS: As per HPI.  All other pertinent ROS negative.     Past Medical History:  Diagnosis Date   Alcoholic (HCC)    Anxiety    Depression    GERD (gastroesophageal reflux disease)    Glaucoma    Hypertension    Prosthetic eye globe    L eye   Substance abuse (HCC)    Past Surgical History:  Procedure Laterality Date   KNEE SURGERY Left 1980s   NOSE SURGERY  1970s   nose fracture   OCULAR PROSTHESIS REMOVAL Left 2017   No Known Allergies No current facility-administered medications on file prior to encounter.   Current Outpatient Medications on File Prior to Encounter  Medication Sig Dispense Refill   ARIPiprazole (ABILIFY) 10 MG tablet Take 10 mg by mouth every morning.     escitalopram (LEXAPRO) 20 MG tablet Take 20 mg by mouth daily.     ibuprofen (ADVIL) 100 MG tablet Take 100 mg by mouth every 6 (six) hours as needed for fever.     metFORMIN (GLUCOPHAGE) 500 MG tablet Take 500 mg by mouth daily.     mirtazapine (REMERON) 15 MG tablet Take 7.5-15 mg by mouth at bedtime.     tamsulosin (FLOMAX) 0.4 MG CAPS capsule Take 0.4 mg by mouth at bedtime.     Social History   Socioeconomic History   Marital status: Single    Spouse name: Not on file   Number of children: Not on file   Years of education: Not on file   Highest education level: Not on file  Occupational History   Not on file  Tobacco Use   Smoking status: Every Day    Packs/day: 0.50    Years: 26.00    Pack years: 13.00     Types: Cigarettes   Smokeless tobacco: Never  Vaping Use   Vaping Use: Never used  Substance and Sexual Activity   Alcohol use: Yes    Comment: 24 oz beer every other day   Drug use: Not Currently    Types: Cocaine, "Crack" cocaine   Sexual activity: Never  Other Topics Concern   Not on file  Social History Narrative   Not on file   Social Determinants of Health   Financial Resource Strain: Not on file  Food Insecurity: Not on file  Transportation Needs: Not on file  Physical Activity: Not on file  Stress: Not on file  Social Connections: Not on file  Intimate Partner Violence: Not on file   Family History  Problem Relation Age of Onset   Cancer Mother    Hypertension Father     OBJECTIVE:  Vitals:   12/27/20 1509  BP: (!) 153/89  Pulse: 71  Resp: 18  Temp: 98.1 F (36.7 C)  TempSrc: Oral  SpO2: 98%    General appearance: alert; no distress Eyes: PERRLA; EOMI HENT: normocephalic; atraumatic Neck: supple with FROM Lungs: clear to auscultation bilaterally Heart: regular rate and rhythm.  Radial pulses 2+ symmetrical bilaterally Extremities: no edema; symmetrical with no gross deformities Skin: warm and dry Neurologic: CN 2-12 grossly intact Psychological: alert and cooperative; normal mood and affect   ASSESSMENT & PLAN:  1. Medication refill   2. Elevated blood pressure reading     Meds ordered this encounter  Medications   lisinopril (ZESTRIL) 5 MG tablet    Sig: Take 1 tablet (5 mg total) by mouth daily.    Dispense:  30 tablet    Refill:  0    Order Specific Question:   Supervising Provider    Answer:   Eustace Moore [[id number]]   Lisinopril 5 mg refilled Please continue to monitor blood pressure at home and keep a log Eat a well balanced diet of fruits, vegetables and lean meats.  Avoid foods high in fat and salt Drink water.  At least half your body weight in ounces Exercise for at least 30 minutes daily Follow up with PCP Return or  go to the ED if you have any new or worsening symptoms such as vision changes, fatigue, dizziness, chest pain, shortness of breath, nausea, swelling in your hands or feet, urinary symptoms, etc...   Reviewed expectations re: course of current medical issues. Questions answered. Outlined signs and symptoms indicating need for more acute intervention. Patient verbalized understanding. After Visit Summary given.    Rennis Harding, PA-C 12/27/20 1517
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
